# Patient Record
Sex: Male | Born: 1990 | Race: White | Hispanic: No | Marital: Single | Smoking: Never smoker
Health system: Southern US, Community
[De-identification: ages and names within clinical notes are randomized; demographics above are authoritative.]

## PROBLEM LIST (undated history)

## (undated) HISTORY — PX: FRACTURE SURGERY: SHX138

---
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
## 2010-07-03 ENCOUNTER — Emergency Department: Payer: Self-pay | Admitting: Emergency Medicine

## 2011-01-03 ENCOUNTER — Emergency Department: Payer: Self-pay | Admitting: Emergency Medicine

## 2013-05-31 ENCOUNTER — Emergency Department: Payer: Self-pay | Admitting: Emergency Medicine

## 2013-06-10 ENCOUNTER — Ambulatory Visit: Payer: Self-pay | Admitting: Unknown Physician Specialty

## 2014-04-25 NOTE — Op Note (Signed)
PATIENT NAME:  Jonathan SlaterBAUMGARTNER, [name] MR#:  [id number] DATE OF BIRTH:  Feb 23, 1990  DATE OF PROCEDURE:  06/10/2013  PREOPERATIVE DIAGNOSIS: Nasal fracture.  POSTOPERATIVE DIAGNOSIS: Nasal fracture.  OPERATION PERFORMED: Closed reduction of nasal fracture.   SURGEON: Davina Pokehapman T. Basilio Meadow, M.D.   OPERATIVE FINDINGS: There was an infracture of the left nasal bone, outfracture of the right nasal bone.   DESCRIPTION OF PROCEDURE: Windy FastRonald was identified in the holding area and taken to the operating room and placed in the supine position. After general laryngeal mask anesthesia, a topical anesthetic of phenylephrine lidocaine was placed on Cottonoid pledgets and placed in each nostril. After approximately 5 minutes these were removed. A nasal elevator was placed inside the left nostril and the left nasal bone was outfractured back in an anatomic position. Using manual technique, the right nasal bone was infractured back into anatomic position. This gave excellent reduction of the nasal fracture. Steri-Strips were then placed across the dorsum of the nose and an Aquaplast was fashioned for the patient and placed across the dorsum after heating. Care was taken not to allow any warm water to enter the eyes. Two inch tape was then placed across the cast to keep it in position. The patient was then returned to anesthesia where he was awakened in the operating room and taken to the recovery room in stable condition.   CULTURES: None.   SPECIMENS: None.   ESTIMATED BLOOD LOSS: Less than 5 mL.  ____________________________ Davina Pokehapman T. Mirjana Tarleton, MD ctm:[id number] D: 06/10/2013 09:35:38 ET T: 06/10/2013 09:42:37 ET JOB#: [id number]  cc: Davina Pokehapman T. Gerald Kuehl, MD, <Dictator> Davina PokeHAPMAN T Takeshia Wenk MD ELECTRONICALLY SIGNED 07/10/2013 13:42

## 2017-12-18 ENCOUNTER — Encounter: Payer: Self-pay | Admitting: Emergency Medicine

## 2017-12-18 ENCOUNTER — Ambulatory Visit
Admission: EM | Admit: 2017-12-18 | Discharge: 2017-12-18 | Disposition: A | Discharge status: Home / Self Care | Payer: BLUE CROSS/BLUE SHIELD | Attending: Family Medicine | Admitting: Family Medicine

## 2017-12-18 ENCOUNTER — Other Ambulatory Visit: Payer: Self-pay

## 2017-12-18 ENCOUNTER — Ambulatory Visit (INDEPENDENT_AMBULATORY_CARE_PROVIDER_SITE_OTHER): Payer: BLUE CROSS/BLUE SHIELD

## 2017-12-18 DIAGNOSIS — S6701XA Crushing injury of right thumb, initial encounter: Secondary | ICD-10-CM | POA: Diagnosis not present

## 2017-12-18 DIAGNOSIS — W230XXA Caught, crushed, jammed, or pinched between moving objects, initial encounter: Secondary | ICD-10-CM

## 2017-12-18 DIAGNOSIS — S60111A Contusion of right thumb with damage to nail, initial encounter: Secondary | ICD-10-CM

## 2017-12-18 MED ORDER — MELOXICAM 15 MG PO TABS: 15.0000 mg | ORAL_TABLET | Freq: Every day | ORAL | 0 refills | Status: AC

## 2017-12-18 MED ORDER — MUPIROCIN 2 % EX OINT: 1.0000 "application " | TOPICAL_OINTMENT | Freq: Three times a day (TID) | CUTANEOUS | 0 refills | Status: AC

## 2017-12-18 NOTE — ED Provider Notes (Signed)
[facility]    CSN: [id number] Arrival date & time: 12/18/17  1637     History   Chief Complaint Chief Complaint  Patient presents with  . finger pain    right thumb    HPI Jonathan Clements is a 27 y.o. male.   HPI  Male presents with right thumb injury that occurred today while at work.  As he was slamming his door in his truck , his thumb was smashed.  Happened about 3 hours prior to this visit.  States he heard a crunching noise.  The tip of his right dominant thumb is swollen and tender.  He has a very small subungual hematoma present.       History reviewed. No pertinent past medical history.  There are no active problems to display for this patient.   Past Surgical History:  Procedure Laterality Date  . FRACTURE SURGERY         Home Medications    Prior to Admission medications   Medication Sig Start Date End Date Taking? Authorizing Provider  meloxicam (MOBIC) 15 MG tablet Take 1 tablet (15 mg total) by mouth daily. 12/18/17   Lutricia Feiloemer, Yvonne Petite P, PA-C  mupirocin ointment (BACTROBAN) 2 % Apply 1 application topically 3 (three) times daily. 12/18/17   Lutricia Feiloemer, Enslie Sahota P, PA-C    Family History Family History  Problem Relation Age of Onset  . Cancer Mother   . Diabetes Father   . Hypertension Father     Social History Social History   Tobacco Use  . Smoking status: Never Smoker  . Smokeless tobacco: Never Used  Substance Use Topics  . Alcohol use: Yes  . Drug use: Never     Allergies   Patient has no known allergies.   Review of Systems Review of Systems  Constitutional: Positive for activity change. Negative for appetite change, chills, fatigue and fever.  Musculoskeletal: Positive for joint swelling.  All other systems reviewed and are negative.    Physical Exam Triage Vital Signs ED Triage Vitals  Enc Vitals Group     BP 12/18/17 1659 135/84     Pulse Rate 12/18/17 1659 79     Resp 12/18/17 1659 16     Temp  12/18/17 1659 97.8 F (36.6 C)     Temp Source 12/18/17 1659 Oral     SpO2 12/18/17 1659 96 %     Weight 12/18/17 1656 180 lb (81.6 kg)     Height 12/18/17 1656 5\' 9"  (1.753 m)     Head Circumference --      Peak Flow --      Pain Score 12/18/17 1655 6     Pain Loc --      Pain Edu? --      Excl. in GC? --    No data found.  Updated Vital Signs BP 135/84 (BP Location: Left Arm)   Pulse 79   Temp 97.8 F (36.6 C) (Oral)   Resp 16   Ht 5\' 9"  (1.753 m)   Wt 180 lb (81.6 kg)   SpO2 96%   BMI 26.58 kg/m   Visual Acuity Right Eye Distance:   Left Eye Distance:   Bilateral Distance:    Right Eye Near:   Left Eye Near:    Bilateral Near:     Physical Exam Vitals signs and nursing note reviewed.  Constitutional:      General: He is not in acute distress.    Appearance: Normal appearance. He  is normal weight. He is not ill-appearing, toxic-appearing or diaphoretic.  HENT:     Head: Normocephalic.     Right Ear: Tympanic membrane, ear canal and external ear normal.     Left Ear: Tympanic membrane, ear canal and external ear normal.     Nose: Nose normal.     Mouth/Throat:     Mouth: Mucous membranes are moist.  Eyes:     Pupils: Pupils are equal, round, and reactive to light.  Musculoskeletal:        General: Swelling, tenderness and signs of injury present.     Comments: Exam of the right dominant thumb distally shows swelling and tenderness as well as tightness of the distal pad.  Patient has a small abrasion proximal to the nail extending up to the nail.  He has a very small subungual hematoma present.  Skin:    General: Skin is warm and dry.  Neurological:     General: No focal deficit present.     Mental Status: He is alert and oriented to person, place, and time.  Psychiatric:        Mood and Affect: Mood normal.        Behavior: Behavior normal.        Thought Content: Thought content normal.        Judgment: Judgment normal.      UC Treatments / Results   Labs (all labs ordered are listed, but only abnormal results are displayed) Labs Reviewed - No data to display  EKG None  Radiology Dg Finger Thumb Right  Result Date: 12/18/2017 CLINICAL DATA:  Crush injury to the RIGHT thumb earlier today, closed in the door of his truck. Initial encounter. EXAM: RIGHT THUMB 2+V COMPARISON:  07/03/2010. FINDINGS: No evidence of acute fracture or dislocation. Joint spaces well preserved. Well-preserved bone mineral density. No intrinsic osseous abnormalities. IMPRESSION: Normal examination. Electronically Signed   By: Hulan Saas M.D.   On: 12/18/2017 17:19    Procedures Procedures (including critical care time)  Medications Ordered in UC Medications - No data to display  Initial Impression / Assessment and Plan / UC Course  I have reviewed the triage vital signs and the nursing notes.  Pertinent labs & imaging results that were available during my care of the patient were reviewed by me and considered in my medical decision making (see chart for details).   X-rays were reviewed with him.  No fractures or dislocations are seen.  Will need elevation and icing of the finger.  Because of the small abrasion does not appear to affect the nail I will have him apply Bactroban ointment to prevent secondary infection.  Stack splint was applied for protection and comfort.  Restrictions placed on him for no pushing lifting or pulling for 7 days.  Is any worsening or is not improving he should follow-up with orthopedic surgery.   Final Clinical Impressions(s) / UC Diagnoses   Final diagnoses:  Crushing injury of right thumb, initial encounter     Discharge Instructions     Apply ice 20 minutes out of every 2 hours 4-5 times daily for comfort.  Splint for protection and comfort.  If your thumb worsens or is not improving follow-up with orthopedic surgery    ED Prescriptions    Medication Sig Dispense Auth. Provider   mupirocin ointment  (BACTROBAN) 2 % Apply 1 application topically 3 (three) times daily. 22 g Ovid Curd P, PA-C   meloxicam (MOBIC) 15 MG tablet Take  1 tablet (15 mg total) by mouth daily. 30 tablet Lutricia Feil, PA-C     Controlled Substance Prescriptions [place] Controlled Substance Registry consulted? Not Applicable   Lutricia Feil, PA-C 12/18/17 1749

## 2017-12-18 NOTE — Discharge Instructions (Addendum)
Apply ice 20 minutes out of every 2 hours 4-5 times daily for comfort.  Splint for protection and comfort.  If your thumb worsens or is not improving follow-up with orthopedic surgery

## 2017-12-18 NOTE — ED Triage Notes (Signed)
Patient c/o right thumb that after slamming his finger in his truck door about 3 hours ago.

## 2019-04-09 ENCOUNTER — Emergency Department
Admission: EM | Admit: 2019-04-09 | Discharge: 2019-04-10 | Disposition: A | Discharge status: Home / Self Care | Payer: Self-pay | Attending: Emergency Medicine | Admitting: Emergency Medicine

## 2019-04-09 ENCOUNTER — Emergency Department: Payer: Self-pay

## 2019-04-09 ENCOUNTER — Other Ambulatory Visit: Payer: Self-pay

## 2019-04-09 DIAGNOSIS — M542 Cervicalgia: Secondary | ICD-10-CM | POA: Insufficient documentation

## 2019-04-09 DIAGNOSIS — S60221A Contusion of right hand, initial encounter: Secondary | ICD-10-CM | POA: Diagnosis not present

## 2019-04-09 DIAGNOSIS — Y9355 Activity, bike riding: Secondary | ICD-10-CM | POA: Diagnosis not present

## 2019-04-09 DIAGNOSIS — Y998 Other external cause status: Secondary | ICD-10-CM | POA: Insufficient documentation

## 2019-04-09 DIAGNOSIS — S50311A Abrasion of right elbow, initial encounter: Secondary | ICD-10-CM | POA: Insufficient documentation

## 2019-04-09 DIAGNOSIS — Z23 Encounter for immunization: Secondary | ICD-10-CM | POA: Insufficient documentation

## 2019-04-09 DIAGNOSIS — Y92410 Unspecified street and highway as the place of occurrence of the external cause: Secondary | ICD-10-CM | POA: Insufficient documentation

## 2019-04-09 DIAGNOSIS — T07XXXA Unspecified multiple injuries, initial encounter: Secondary | ICD-10-CM

## 2019-04-09 DIAGNOSIS — S60229A Contusion of unspecified hand, initial encounter: Secondary | ICD-10-CM

## 2019-04-09 DIAGNOSIS — S42002A Fracture of unspecified part of left clavicle, initial encounter for closed fracture: Secondary | ICD-10-CM | POA: Diagnosis not present

## 2019-04-09 DIAGNOSIS — S4992XA Unspecified injury of left shoulder and upper arm, initial encounter: Secondary | ICD-10-CM | POA: Diagnosis present

## 2019-04-09 LAB — COMPREHENSIVE METABOLIC PANEL WITH GFR
ALT: 26 U/L (ref 0–44)
AST: 29 U/L (ref 15–41)
Albumin: 4.7 g/dL (ref 3.5–5.0)
Alkaline Phosphatase: 64 U/L (ref 38–126)
Anion gap: 10 (ref 5–15)
BUN: 16 mg/dL (ref 6–20)
CO2: 24 mmol/L (ref 22–32)
Calcium: 9.7 mg/dL (ref 8.9–10.3)
Chloride: 107 mmol/L (ref 98–111)
Creatinine, Ser: 1.09 mg/dL (ref 0.61–1.24)
GFR calc Af Amer: >60 mL/min
GFR calc non Af Amer: >60 mL/min
Glucose, Bld: 121 mg/dL — ABNORMAL HIGH (ref 70–99)
Potassium: 3.5 mmol/L (ref 3.5–5.1)
Sodium: 141 mmol/L (ref 135–145)
Total Bilirubin: 1.4 mg/dL — ABNORMAL HIGH (ref 0.3–1.2)
Total Protein: 7.9 g/dL (ref 6.5–8.1)

## 2019-04-09 LAB — CBC
HCT: 47 % (ref 39.0–52.0)
Hemoglobin: 16.6 g/dL (ref 13.0–17.0)
MCH: 30.4 pg (ref 26.0–34.0)
MCHC: 35.3 g/dL (ref 30.0–36.0)
MCV: 86.1 fL (ref 80.0–100.0)
Platelets: 354 10*3/uL (ref 150–400)
RBC: 5.46 MIL/uL (ref 4.22–5.81)
RDW: 12.3 % (ref 11.5–15.5)
WBC: 11 10*3/uL — ABNORMAL HIGH (ref 4.0–10.5)
nRBC: 0 % (ref 0.0–0.2)

## 2019-04-09 LAB — PROTIME-INR
INR: 1 (ref 0.8–1.2)
Prothrombin Time: 12.6 seconds (ref 11.4–15.2)

## 2019-04-09 LAB — SAMPLE TO BLOOD BANK

## 2019-04-09 LAB — LACTIC ACID, PLASMA: Lactic Acid, Venous: 1.7 mmol/L (ref 0.5–1.9)

## 2019-04-09 LAB — CKMB (ARMC ONLY): CK, MB: 2.4 ng/mL (ref 0.5–5.0)

## 2019-04-09 LAB — ETHANOL: Alcohol, Ethyl (B): <10 mg/dL

## 2019-04-09 LAB — APTT: aPTT: 27 s (ref 24–36)

## 2019-04-09 MED ORDER — FENTANYL CITRATE (PF) 100 MCG/2ML IJ SOLN
50.0000 ug | Freq: Once | INTRAMUSCULAR | Status: AC
  Administered 2019-04-09: 50 ug via INTRAVENOUS
  Filled 2019-04-09: qty 2

## 2019-04-09 MED ORDER — HYDROCODONE-ACETAMINOPHEN 5-325 MG PO TABS
2.0000 | ORAL_TABLET | Freq: Once | ORAL | Status: AC
  Administered 2019-04-09: 2 via ORAL
  Filled 2019-04-09: qty 2

## 2019-04-09 MED ORDER — TETANUS-DIPHTH-ACELL PERTUSSIS 5-2.5-18.5 LF-MCG/0.5 IM SUSP
0.5000 mL | Freq: Once | INTRAMUSCULAR | Status: AC
  Administered 2019-04-09: 0.5 mL via INTRAMUSCULAR
  Filled 2019-04-09: qty 0.5

## 2019-04-09 MED ORDER — HYDROCODONE-ACETAMINOPHEN 5-325 MG PO TABS: 1.0000 | ORAL_TABLET | Freq: Four times a day (QID) | ORAL | 0 refills | Status: AC | PRN

## 2019-04-09 MED ORDER — KETOROLAC TROMETHAMINE 30 MG/ML IJ SOLN
30.0000 mg | Freq: Once | INTRAMUSCULAR | Status: AC
  Administered 2019-04-09: 30 mg via INTRAVENOUS
  Filled 2019-04-09: qty 1

## 2019-04-09 MED ORDER — FENTANYL CITRATE (PF) 100 MCG/2ML IJ SOLN
75.0000 ug | Freq: Once | INTRAMUSCULAR | Status: AC
  Administered 2019-04-09: 75 ug via INTRAVENOUS
  Filled 2019-04-09: qty 2

## 2019-04-09 MED ORDER — ONDANSETRON HCL 4 MG/2ML IJ SOLN
4.0000 mg | Freq: Once | INTRAMUSCULAR | Status: AC
  Administered 2019-04-09: 4 mg via INTRAVENOUS
  Filled 2019-04-09: qty 2

## 2019-04-09 MED ORDER — IOHEXOL 300 MG/ML  SOLN
100.0000 mL | Freq: Once | INTRAMUSCULAR | Status: AC | PRN
  Administered 2019-04-09: 100 mL via INTRAVENOUS
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
## 2019-04-09 NOTE — Discharge Instructions (Signed)
No driving tonight or while taking hydrocodone.

## 2019-04-10 MED ORDER — BACITRACIN ZINC 500 UNIT/GM EX OINT
TOPICAL_OINTMENT | Freq: Two times a day (BID) | CUTANEOUS | Status: DC
  Administered 2019-04-10: 1 via TOPICAL
  Filled 2019-04-10: qty 2.7
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
## 2021-10-05 ENCOUNTER — Ambulatory Visit
Admission: EM | Admit: 2021-10-05 | Discharge: 2021-10-05 | Disposition: A | Discharge status: Home / Self Care | Payer: Self-pay

## 2021-10-05 DIAGNOSIS — B9689 Other specified bacterial agents as the cause of diseases classified elsewhere: Secondary | ICD-10-CM

## 2021-10-05 DIAGNOSIS — J019 Acute sinusitis, unspecified: Secondary | ICD-10-CM

## 2021-10-05 MED ORDER — METHYLPREDNISOLONE 4 MG PO TBPK: ORAL_TABLET | ORAL | 0 refills | Status: AC

## 2021-10-05 MED ORDER — AMOXICILLIN-POT CLAVULANATE 875-125 MG PO TABS: 1.0000 | ORAL_TABLET | Freq: Two times a day (BID) | ORAL | 0 refills | Status: AC

## 2021-10-05 NOTE — Discharge Instructions (Addendum)
Follow up here or with your primary care provider if your symptoms are worsening or not improving with treatment.     

## 2021-10-05 NOTE — ED Triage Notes (Signed)
Pt. States for the past month he has had increasing sinus pressure, nasal drainage and congestion. Pt. States he has been taking OTC medcation w/ no relief.

## 2021-10-05 NOTE — ED Provider Notes (Signed)
[facility]    CSN: [id number] Arrival date & time: 10/05/21  1034      History   Chief Complaint Chief Complaint  Patient presents with   Nasal Congestion   Facial Pain    HPI Jonathan Clements is a 31 y.o. male.   HPI  Presents to urgent care with complaint of symptoms times approximately 1 month.  Endorses sinus pressure, headache, nasal drainage, nasal congestion. Denies fever, myalgias, chills. Denies GI symptoms.  Taking OTC medication without relief.  History reviewed. No pertinent past medical history.  There are no problems to display for this patient.   Past Surgical History:  Procedure Laterality Date   FRACTURE SURGERY         Home Medications    Prior to Admission medications   Medication Sig Start Date End Date Taking? Authorizing Provider  citalopram (CELEXA) 20 MG tablet Take by mouth. 12/02/15  Yes [provider]  triamcinolone cream (KENALOG) 0.1 % Apply topically 2 (two) times daily. 05/08/13  Yes [provider]  HYDROcodone-acetaminophen (NORCO/VICODIN) 5-325 MG tablet Take 1 tablet by mouth every 6 (six) hours as needed for moderate pain. 04/09/19   Sharyn Creamer, MD  meloxicam (MOBIC) 15 MG tablet Take 1 tablet (15 mg total) by mouth daily. 12/18/17   Lutricia Feil, PA-C  mupirocin ointment (BACTROBAN) 2 % Apply 1 application topically 3 (three) times daily. 12/18/17   Lutricia Feil, PA-C    Family History Family History  Problem Relation Age of Onset   Cancer Mother    Diabetes Father    Hypertension Father     Social History Social History   Tobacco Use   Smoking status: Never   Smokeless tobacco: Never  Vaping Use   Vaping Use: Never used  Substance Use Topics   Alcohol use: Yes   Drug use: Never     Allergies   Patient has no known allergies.   Review of Systems Review of Systems   Physical Exam Triage Vital Signs ED Triage Vitals [10/05/21 1052]  Enc Vitals Group     BP  137/83     Pulse Rate 78     Resp 18     Temp 98.1 F (36.7 C)     Temp src      SpO2 97 %     Weight      Height      Head Circumference      Peak Flow      Pain Score 4     Pain Loc      Pain Edu?      Excl. in GC?    No data found.  Updated Vital Signs BP 137/83   Pulse 78   Temp 98.1 F (36.7 C)   Resp 18   SpO2 97%   Visual Acuity Right Eye Distance:   Left Eye Distance:   Bilateral Distance:    Right Eye Near:   Left Eye Near:    Bilateral Near:     Physical Exam Vitals reviewed.  Constitutional:      Appearance: Normal appearance.  HENT:     Right Ear: Tympanic membrane normal.     Left Ear: Tympanic membrane normal.     Nose:     Right Sinus: Maxillary sinus tenderness present.     Left Sinus: Maxillary sinus tenderness present.  Cardiovascular:     Rate and Rhythm: Normal rate and regular rhythm.     Pulses: Normal  pulses.     Heart sounds: Normal heart sounds.  Pulmonary:     Effort: Pulmonary effort is normal.     Breath sounds: Normal breath sounds.  Skin:    General: Skin is warm and dry.  Neurological:     General: No focal deficit present.     Mental Status: He is alert and oriented to person, place, and time.  Psychiatric:        Mood and Affect: Mood normal.        Behavior: Behavior normal.      UC Treatments / Results  Labs (all labs ordered are listed, but only abnormal results are displayed) Labs Reviewed - No data to display  EKG   Radiology No results found.  Procedures Procedures (including critical care time)  Medications Ordered in UC Medications - No data to display  Initial Impression / Assessment and Plan / UC Course  I have reviewed the triage vital signs and the nursing notes.  Pertinent labs & imaging results that were available during my care of the patient were reviewed by me and considered in my medical decision making (see chart for details).   We will treat for bacterial rhinosinusitis given  duration of symptoms with Augmentin x10 days.  Will also prescribe Medrol taper to relieve sinus inflammation.   Final Clinical Impressions(s) / UC Diagnoses   Final diagnoses:  None   Discharge Instructions   None    ED Prescriptions   None    PDMP not reviewed this encounter.   Rose Phi, [place] 10/05/21 1109

## 2021-12-23 ENCOUNTER — Ambulatory Visit
Admission: EM | Admit: 2021-12-23 | Discharge: 2021-12-23 | Disposition: A | Discharge status: Home / Self Care | Payer: Medicaid Other | Attending: Urgent Care | Admitting: Urgent Care

## 2021-12-23 DIAGNOSIS — R6889 Other general symptoms and signs: Secondary | ICD-10-CM

## 2021-12-23 DIAGNOSIS — R112 Nausea with vomiting, unspecified: Secondary | ICD-10-CM | POA: Diagnosis not present

## 2021-12-23 MED ORDER — OSELTAMIVIR PHOSPHATE 75 MG PO CAPS: 75.0000 mg | ORAL_CAPSULE | Freq: Two times a day (BID) | ORAL | 0 refills | Status: AC

## 2021-12-23 MED ORDER — ONDANSETRON 4 MG PO TBDP: 4.0000 mg | ORAL_TABLET | Freq: Three times a day (TID) | ORAL | 0 refills | Status: AC | PRN

## 2021-12-23 NOTE — Discharge Instructions (Signed)
You have been diagnosed with a viral upper respiratory infection based on your symptoms and exam. Viral illnesses cannot be treated with antibiotics - they are self limiting - and you should find your symptoms resolving within a few days. Get plenty of rest and non-caffeinated fluids. Watch for signs of dehydration including reduced urine output and dark colored urine.  I have prescribed Tamiflu, antiviral therapy for influenza A, based on a presumptive diagnosis of influenza.   We recommend you use over-the-counter medications for symptom control including Tylenol or ibuprofen for fever, chills or body aches, and cold/cough medication.  Saline mist spray is helpful for removing excess mucus from your nose.  Room humidifiers are helpful to ease breathing at night. I recommend guaifenesin (Mucinex) to help thin and loosen mucus secretions in your respiratory passages.   If appropriate based upon your other medical problems, you might also find relief of nasal/sinus congestion symptoms by using a nasal decongestant such as Flonase (fluticasone) or Sudafed sinus (pseudoephedrine).  You will need to obtain Sudafed from behind the pharmacist counter.  Speak to the pharmacist to verify that you are not duplicating medications with other over-the-counter formulations that you may be using.   Follow up here or with your primary care provider if your symptoms are worsening or not improving.    

## 2021-12-23 NOTE — ED Provider Notes (Signed)
[facility]    CSN: [id number] Arrival date & time: 12/23/21  1629      History   Chief Complaint Chief Complaint  Patient presents with   Headache   Emesis   Fever    HPI Jonathan Clements is a 31 y.o. male.    Headache Associated symptoms: fever and vomiting   Emesis Associated symptoms: fever and headaches   Fever Associated symptoms: headaches and vomiting     Presents to urgent care with headache, fever, hot flashes and vomiting starting yesterday.  History reviewed. No pertinent past medical history.  There are no problems to display for this patient.   Past Surgical History:  Procedure Laterality Date   FRACTURE SURGERY         Home Medications    Prior to Admission medications   Medication Sig Start Date End Date Taking? Authorizing Provider  citalopram (CELEXA) 20 MG tablet Take by mouth. 12/02/15   [provider]  HYDROcodone-acetaminophen (NORCO/VICODIN) 5-325 MG tablet Take 1 tablet by mouth every 6 (six) hours as needed for moderate pain. 04/09/19   Sharyn Creamer, MD  meloxicam (MOBIC) 15 MG tablet Take 1 tablet (15 mg total) by mouth daily. 12/18/17   Lutricia Feil, PA-C  methylPREDNISolone (MEDROL DOSEPAK) 4 MG TBPK tablet Steroid taper. Take as directed by packaging. 10/05/21   Kerstyn Coryell, Jeannett Senior, FNP  mupirocin ointment (BACTROBAN) 2 % Apply 1 application topically 3 (three) times daily. 12/18/17   Lutricia Feil, PA-C  triamcinolone cream (KENALOG) 0.1 % Apply topically 2 (two) times daily. 05/08/13   [provider]    Family History Family History  Problem Relation Age of Onset   Cancer Mother    Diabetes Father    Hypertension Father     Social History Social History   Tobacco Use   Smoking status: Never   Smokeless tobacco: Never  Vaping Use   Vaping Use: Never used  Substance Use Topics   Alcohol use: Yes   Drug use: Never     Allergies   Patient has no known allergies.   Review of  Systems Review of Systems  Constitutional:  Positive for fever.  Gastrointestinal:  Positive for vomiting.  Neurological:  Positive for headaches.     Physical Exam Triage Vital Signs ED Triage Vitals  Enc Vitals Group     BP 12/23/21 1715 125/80     Pulse Rate 12/23/21 1715 70     Resp 12/23/21 1715 17     Temp 12/23/21 1715 100.2 F (37.9 C)     Temp src --      SpO2 12/23/21 1715 98 %     Weight --      Height --      Head Circumference --      Peak Flow --      Pain Score 12/23/21 1718 0     Pain Loc --      Pain Edu? --      Excl. in GC? --    No data found.  Updated Vital Signs BP 125/80   Pulse 70   Temp 100.2 F (37.9 C)   Resp 17   SpO2 98%   Visual Acuity Right Eye Distance:   Left Eye Distance:   Bilateral Distance:    Right Eye Near:   Left Eye Near:    Bilateral Near:     Physical Exam Vitals reviewed.  Constitutional:      Appearance: He is ill-appearing.  HENT:     Mouth/Throat:     Pharynx: Posterior oropharyngeal erythema present. No oropharyngeal exudate.     Tonsils: 2+ on the right. 2+ on the left.  Cardiovascular:     Rate and Rhythm: Normal rate and regular rhythm.     Heart sounds: Normal heart sounds.  Pulmonary:     Effort: Pulmonary effort is normal.     Breath sounds: Normal breath sounds.  Skin:    General: Skin is warm and dry.  Neurological:     Mental Status: He is alert and oriented to person, place, and time.  Psychiatric:        Mood and Affect: Mood normal.        Behavior: Behavior normal.      UC Treatments / Results  Labs (all labs ordered are listed, but only abnormal results are displayed) Labs Reviewed - No data to display  EKG   Radiology No results found.  Procedures Procedures (including critical care time)  Medications Ordered in UC Medications - No data to display  Initial Impression / Assessment and Plan / UC Course  I have reviewed the triage vital signs and the nursing  notes.  Pertinent labs & imaging results that were available during my care of the patient were reviewed by me and considered in my medical decision making (see chart for details).   Patient is afebrile here without recent antipyretics. Satting well on room air. Overall is ill appearing, well hydrated, without respiratory distress. Pulmonary exam is unremarkable.  Lungs CTAB without wheezing, rhonchi, rales.  Pharynx is erythematous without peritonsillar exudates.  Tonsils are 2+ bilaterally.  Symptoms consistent with influenza and will treat presumptively for influenza A in the context of limited testing capabilities.  Will give Zofran for nausea and recommend use of OTC medication for symptom control.  Final Clinical Impressions(s) / UC Diagnoses   Final diagnoses:  None   Discharge Instructions   None    ED Prescriptions   None    PDMP not reviewed this encounter.   Charma Igo, [place] 12/23/21 1733

## 2021-12-23 NOTE — ED Triage Notes (Signed)
Pt. Presents to UC w/ c/o a headache, fever, hot flashes and emesis that started yesterday.
